# Patient Record
Sex: Female | Born: 1960 | Race: White | Hispanic: No | Marital: Married | Smoking: Current every day smoker
Health system: Southern US, Community
[De-identification: ages and names within clinical notes are randomized; demographics above are authoritative.]

## PROBLEM LIST (undated history)

## (undated) DIAGNOSIS — I10 Essential (primary) hypertension: Secondary | ICD-10-CM

## (undated) DIAGNOSIS — E7800 Pure hypercholesterolemia, unspecified: Secondary | ICD-10-CM

## (undated) DIAGNOSIS — E079 Disorder of thyroid, unspecified: Secondary | ICD-10-CM

## (undated) HISTORY — PX: BUNIONECTOMY: SHX129

## (undated) HISTORY — PX: COLONOSCOPY: SHX174

---

## 2000-02-19 ENCOUNTER — Encounter (INDEPENDENT_AMBULATORY_CARE_PROVIDER_SITE_OTHER): Payer: Self-pay | Admitting: Specialist

## 2000-02-19 ENCOUNTER — Other Ambulatory Visit: Admission: RE | Admit: 2000-02-19 | Discharge: 2000-02-19 | Payer: Self-pay | Admitting: *Deleted

## 2004-01-02 ENCOUNTER — Other Ambulatory Visit: Admission: RE | Admit: 2004-01-02 | Discharge: 2004-01-02 | Payer: Self-pay | Admitting: *Deleted

## 2004-01-03 ENCOUNTER — Encounter: Admission: RE | Admit: 2004-01-03 | Discharge: 2004-01-03 | Payer: Self-pay | Admitting: *Deleted

## 2005-03-04 ENCOUNTER — Other Ambulatory Visit: Admission: RE | Admit: 2005-03-04 | Discharge: 2005-03-04 | Payer: Self-pay | Admitting: *Deleted

## 2006-04-19 ENCOUNTER — Other Ambulatory Visit: Admission: RE | Admit: 2006-04-19 | Discharge: 2006-04-19 | Payer: Self-pay | Admitting: *Deleted

## 2007-05-01 ENCOUNTER — Other Ambulatory Visit: Admission: RE | Admit: 2007-05-01 | Discharge: 2007-05-01 | Payer: Self-pay | Admitting: *Deleted

## 2007-06-09 ENCOUNTER — Encounter: Admission: RE | Admit: 2007-06-09 | Discharge: 2007-06-09 | Payer: Self-pay | Admitting: *Deleted

## 2008-06-10 ENCOUNTER — Emergency Department (HOSPITAL_COMMUNITY): Admission: EM | Admit: 2008-06-10 | Discharge: 2008-06-11 | Payer: Self-pay | Admitting: Emergency Medicine

## 2008-11-08 ENCOUNTER — Encounter: Admission: RE | Admit: 2008-11-08 | Discharge: 2008-11-08 | Payer: Self-pay | Admitting: Obstetrics and Gynecology

## 2009-03-14 ENCOUNTER — Encounter: Admission: RE | Admit: 2009-03-14 | Discharge: 2009-03-14 | Payer: Self-pay | Admitting: Gastroenterology

## 2009-06-13 ENCOUNTER — Encounter: Admission: RE | Admit: 2009-06-13 | Discharge: 2009-06-13 | Payer: Self-pay | Admitting: Gastroenterology

## 2009-10-21 ENCOUNTER — Emergency Department (HOSPITAL_BASED_OUTPATIENT_CLINIC_OR_DEPARTMENT_OTHER): Admission: EM | Admit: 2009-10-21 | Discharge: 2009-10-21 | Payer: Self-pay | Admitting: Emergency Medicine

## 2009-10-21 ENCOUNTER — Ambulatory Visit: Payer: Self-pay | Admitting: Diagnostic Radiology

## 2010-05-15 ENCOUNTER — Encounter: Admission: RE | Admit: 2010-05-15 | Discharge: 2010-05-15 | Payer: Self-pay | Admitting: Obstetrics and Gynecology

## 2011-02-12 ENCOUNTER — Inpatient Hospital Stay (HOSPITAL_COMMUNITY)
Admission: EM | Admit: 2011-02-12 | Discharge: 2011-02-15 | DRG: 392 | Disposition: A | Discharge status: Home / Self Care | Payer: 59 | Attending: Internal Medicine | Admitting: Internal Medicine

## 2011-02-12 ENCOUNTER — Emergency Department (HOSPITAL_COMMUNITY): Payer: 59

## 2011-02-12 DIAGNOSIS — R197 Diarrhea, unspecified: Secondary | ICD-10-CM | POA: Diagnosis present

## 2011-02-12 DIAGNOSIS — K5732 Diverticulitis of large intestine without perforation or abscess without bleeding: Principal | ICD-10-CM | POA: Diagnosis present

## 2011-02-12 DIAGNOSIS — I10 Essential (primary) hypertension: Secondary | ICD-10-CM | POA: Diagnosis present

## 2011-02-12 DIAGNOSIS — F17200 Nicotine dependence, unspecified, uncomplicated: Secondary | ICD-10-CM | POA: Diagnosis present

## 2011-02-12 LAB — DIFFERENTIAL
Basophils Absolute: 0 10*3/uL (ref 0.0–0.1)
Basophils Relative: 0 % (ref 0–1)
Eosinophils Relative: 1 % (ref 0–5)
Lymphocytes Relative: 11 % — ABNORMAL LOW (ref 12–46)
Lymphs Abs: 1.1 10*3/uL (ref 0.7–4.0)
Monocytes Relative: 10 % (ref 3–12)
Neutro Abs: 7.3 10*3/uL (ref 1.7–7.7)
Neutrophils Relative %: 78 % — ABNORMAL HIGH (ref 43–77)

## 2011-02-12 LAB — URINE MICROSCOPIC-ADD ON

## 2011-02-12 LAB — URINALYSIS, ROUTINE W REFLEX MICROSCOPIC
Bilirubin Urine: NEGATIVE
Ketones, ur: NEGATIVE mg/dL
Leukocytes, UA: NEGATIVE
Nitrite: NEGATIVE
Protein, ur: NEGATIVE mg/dL
Specific Gravity, Urine: 1.025 (ref 1.005–1.030)
Urine Glucose, Fasting: NEGATIVE mg/dL
Urobilinogen, UA: 0.2 mg/dL (ref 0.0–1.0)
pH: 5.5 (ref 5.0–8.0)

## 2011-02-12 LAB — POCT I-STAT, CHEM 8
BUN: 9 mg/dL (ref 6–23)
Calcium, Ion: 1.18 mmol/L (ref 1.12–1.32)
Chloride: 106 mEq/L (ref 96–112)
Creatinine, Ser: 1.1 mg/dL (ref 0.4–1.2)
Glucose, Bld: 100 mg/dL — ABNORMAL HIGH (ref 70–99)
HCT: 41 % (ref 36.0–46.0)
Hemoglobin: 13.9 g/dL (ref 12.0–15.0)
Potassium: 3.7 mEq/L (ref 3.5–5.1)
Sodium: 139 mEq/L (ref 135–145)
TCO2: 23 mmol/L (ref 0–100)

## 2011-02-12 LAB — CBC
HCT: 39.3 % (ref 36.0–46.0)
Hemoglobin: 13.6 g/dL (ref 12.0–15.0)
MCH: 34.6 pg — ABNORMAL HIGH (ref 26.0–34.0)
MCHC: 34.6 g/dL (ref 30.0–36.0)
MCV: 100 fL (ref 78.0–100.0)
Platelets: 210 10*3/uL (ref 150–400)
RBC: 3.93 MIL/uL (ref 3.87–5.11)
RDW: 11.8 % (ref 11.5–15.5)
WBC: 9.4 10*3/uL (ref 4.0–10.5)

## 2011-02-12 LAB — TSH: TSH: 2.344 u[IU]/mL (ref 0.350–4.500)

## 2011-02-12 MED ORDER — IOHEXOL 300 MG/ML  SOLN
100.0000 mL | Freq: Once | INTRAMUSCULAR | Status: AC | PRN
  Administered 2011-02-12: 100 mL via INTRAVENOUS

## 2011-02-13 LAB — CBC
HCT: 36.3 % (ref 36.0–46.0)
Hemoglobin: 12.2 g/dL (ref 12.0–15.0)
MCH: 33.7 pg (ref 26.0–34.0)
MCHC: 33.6 g/dL (ref 30.0–36.0)
MCV: 100.3 fL — ABNORMAL HIGH (ref 78.0–100.0)
Platelets: 177 10*3/uL (ref 150–400)
RBC: 3.62 MIL/uL — ABNORMAL LOW (ref 3.87–5.11)
RDW: 11.7 % (ref 11.5–15.5)

## 2011-02-13 LAB — COMPREHENSIVE METABOLIC PANEL
ALT: 10 U/L (ref 0–35)
Albumin: 2.9 g/dL — ABNORMAL LOW (ref 3.5–5.2)
Alkaline Phosphatase: 48 U/L (ref 39–117)
BUN: 4 mg/dL — ABNORMAL LOW (ref 6–23)
CO2: 25 mEq/L (ref 19–32)
Calcium: 8.7 mg/dL (ref 8.4–10.5)
Chloride: 111 mEq/L (ref 96–112)
Creatinine, Ser: 0.95 mg/dL (ref 0.4–1.2)
GFR calc Af Amer: >60 mL/min (ref >60)
GFR calc non Af Amer: >60 mL/min (ref >60)
Glucose, Bld: 102 mg/dL — ABNORMAL HIGH (ref 70–99)
Potassium: 3.6 mEq/L (ref 3.5–5.1)
Sodium: 140 mEq/L (ref 135–145)
Total Protein: 5.6 g/dL — ABNORMAL LOW (ref 6.0–8.3)

## 2011-02-13 LAB — DIFFERENTIAL
Basophils Absolute: 0 10*3/uL (ref 0.0–0.1)
Basophils Relative: 0 % (ref 0–1)
Eosinophils Relative: 3 % (ref 0–5)
Lymphs Abs: 1 10*3/uL (ref 0.7–4.0)
Monocytes Absolute: 0.4 10*3/uL (ref 0.1–1.0)
Monocytes Relative: 8 % (ref 3–12)
Neutro Abs: 2.9 10*3/uL (ref 1.7–7.7)

## 2011-02-13 LAB — MAGNESIUM: Magnesium: 2.1 mg/dL (ref 1.5–2.5)

## 2011-02-13 LAB — PHOSPHORUS: Phosphorus: 3.1 mg/dL (ref 2.3–4.6)

## 2011-02-13 LAB — LIPID PANEL: VLDL: 20 mg/dL (ref 0–40)

## 2011-02-14 LAB — CBC
HCT: 34.5 % — ABNORMAL LOW (ref 36.0–46.0)
Hemoglobin: 11.5 g/dL — ABNORMAL LOW (ref 12.0–15.0)
MCV: 101.5 fL — ABNORMAL HIGH (ref 78.0–100.0)
RBC: 3.4 MIL/uL — ABNORMAL LOW (ref 3.87–5.11)
WBC: 4.3 10*3/uL (ref 4.0–10.5)

## 2011-02-14 LAB — DIFFERENTIAL
Lymphocytes Relative: 23 % (ref 12–46)
Lymphs Abs: 1 10*3/uL (ref 0.7–4.0)
Monocytes Relative: 9 % (ref 3–12)
Neutrophils Relative %: 64 % (ref 43–77)

## 2011-02-14 LAB — BASIC METABOLIC PANEL
BUN: 2 mg/dL — ABNORMAL LOW (ref 6–23)
CO2: 24 mEq/L (ref 19–32)
Chloride: 112 mEq/L (ref 96–112)
Glucose, Bld: 99 mg/dL (ref 70–99)
Potassium: 4 mEq/L (ref 3.5–5.1)

## 2011-02-14 LAB — URINE CULTURE
Colony Count: NO GROWTH
Culture  Setup Time: 201202250141
Culture: NO GROWTH
Special Requests: NEGATIVE

## 2011-02-14 LAB — MAGNESIUM: Magnesium: 2 mg/dL (ref 1.5–2.5)

## 2011-02-15 LAB — CBC
MCV: 99.2 fL (ref 78.0–100.0)
Platelets: 195 10*3/uL (ref 150–400)
RDW: 11.4 % — ABNORMAL LOW (ref 11.5–15.5)
WBC: 4.6 10*3/uL (ref 4.0–10.5)

## 2011-02-15 LAB — BASIC METABOLIC PANEL
BUN: 1 mg/dL — ABNORMAL LOW (ref 6–23)
Creatinine, Ser: 0.85 mg/dL (ref 0.4–1.2)
GFR calc non Af Amer: >60 mL/min (ref >60)
Potassium: 4.1 mEq/L (ref 3.5–5.1)

## 2011-02-15 LAB — DIFFERENTIAL
Basophils Relative: 0 % (ref 0–1)
Eosinophils Absolute: 0.2 10*3/uL (ref 0.0–0.7)
Eosinophils Relative: 4 % (ref 0–5)
Lymphs Abs: 1.1 10*3/uL (ref 0.7–4.0)

## 2011-02-15 LAB — MAGNESIUM: Magnesium: 2 mg/dL (ref 1.5–2.5)

## 2011-02-25 IMAGING — CT CT ABD-PELV W/ CM
2 of 5 series · 17 of 46 positions shown, 19 images · IV contrast (agent unspecified)
Comparison: 10/21/2009.  06/13/2009.

CLINICAL DATA: Left lower quadrant pain.  History of
diverticulitis.

CT ABDOMEN AND PELVIS WITH CONTRAST
TECHNIQUE: Multidetector CT imaging of the abdomen and pelvis was
performed following the standard protocol during bolus
administration of intravenous contrast.
Contrast: 100 ml Fmnipaque-YCC.

[Series 2: rtn a/p with · axial · 0.63mm/px · z∈[-574,-198]mm · 14 of 85 slices shown, 16 images]
[im 5/85  soft-tissue]
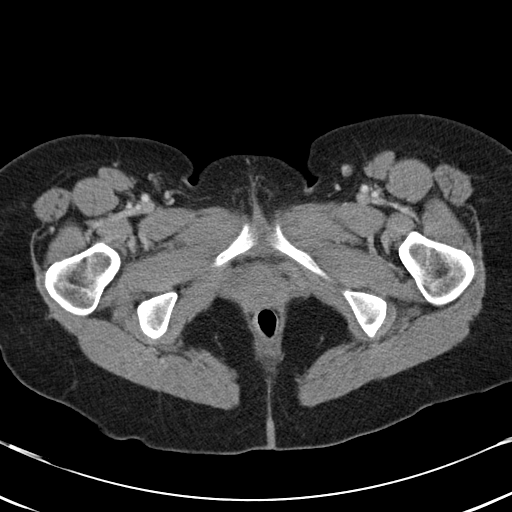
[im 5/85  bone]
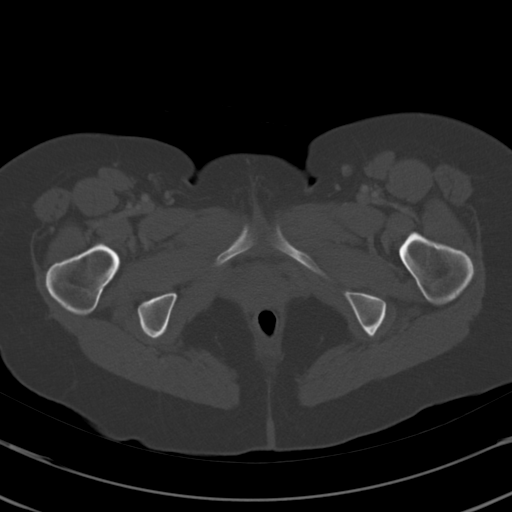
[im 9/85  soft-tissue]
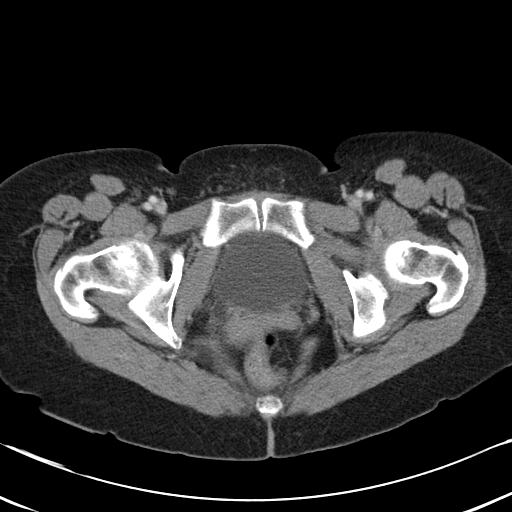
[im 18/85  soft-tissue]
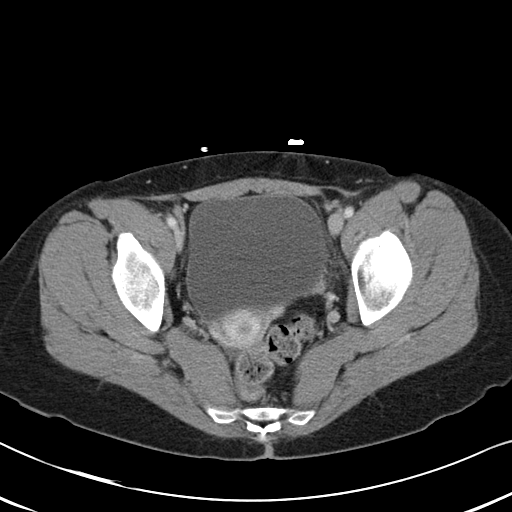
[im 23/85  soft-tissue]
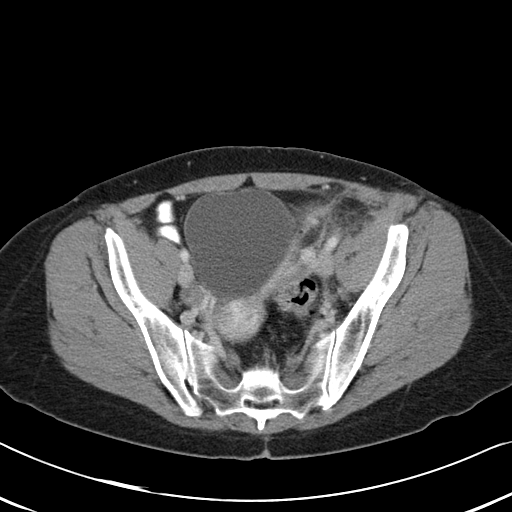
[im 27/85  soft-tissue]
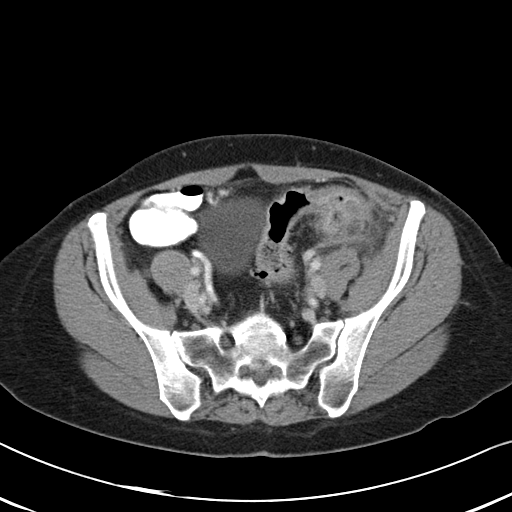
[im 36/85  soft-tissue]
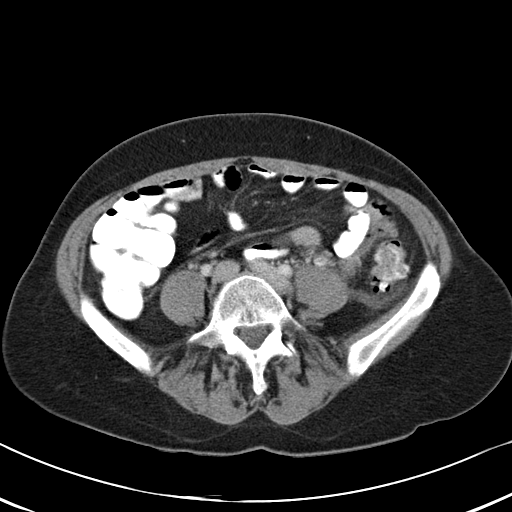
[im 40/85  soft-tissue]
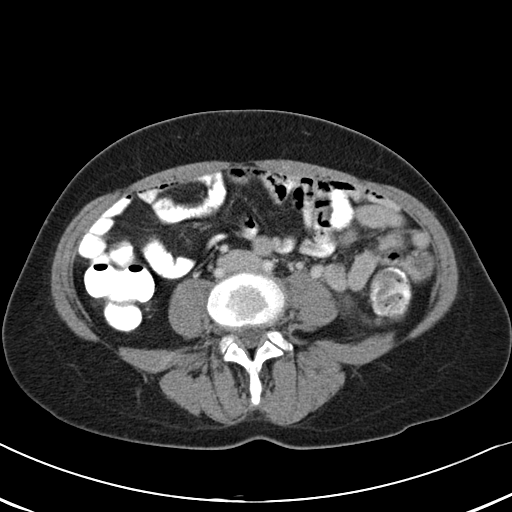
[im 45/85  soft-tissue]
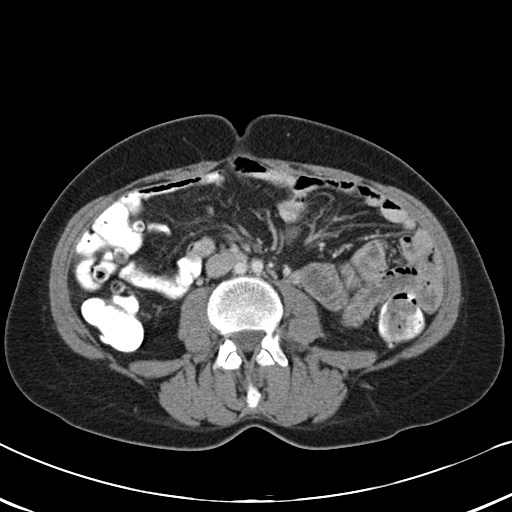
[im 49/85  soft-tissue]
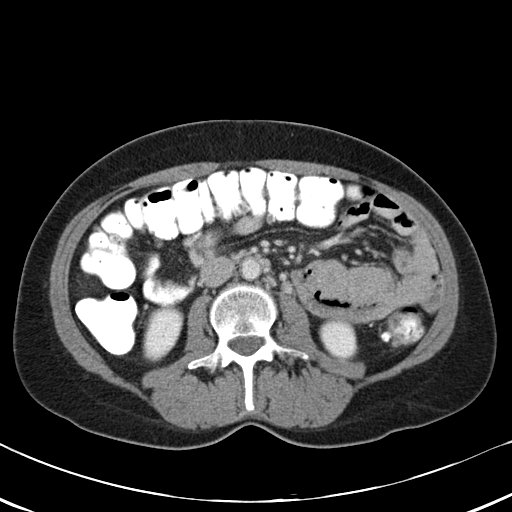
[im 49/85  bone]
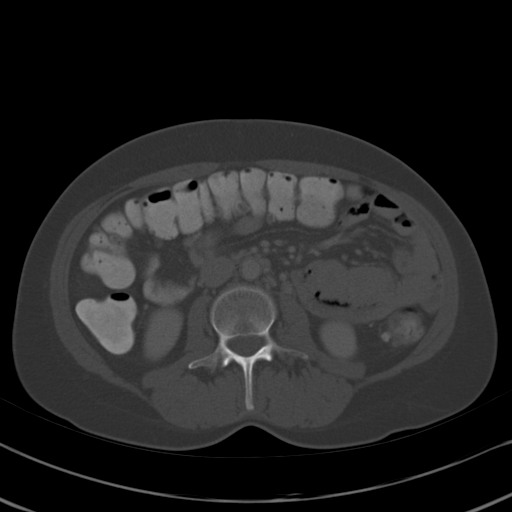
[im 58/85  soft-tissue]
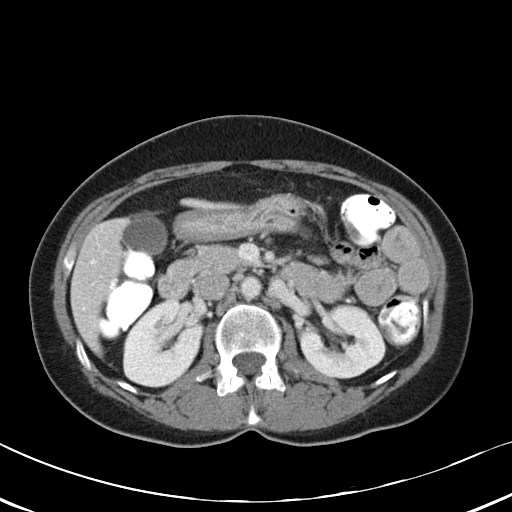
[im 62/85  soft-tissue]
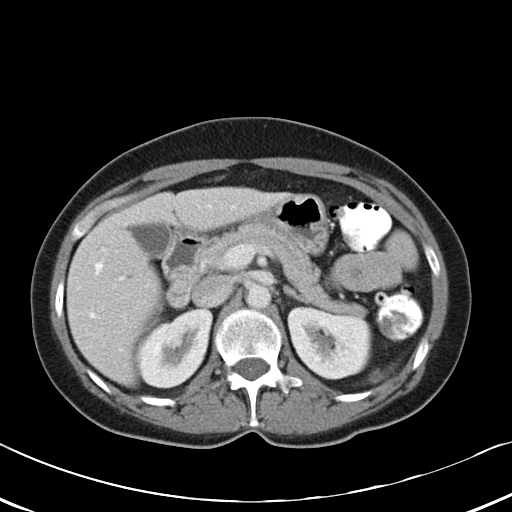
[im 67/85  soft-tissue]
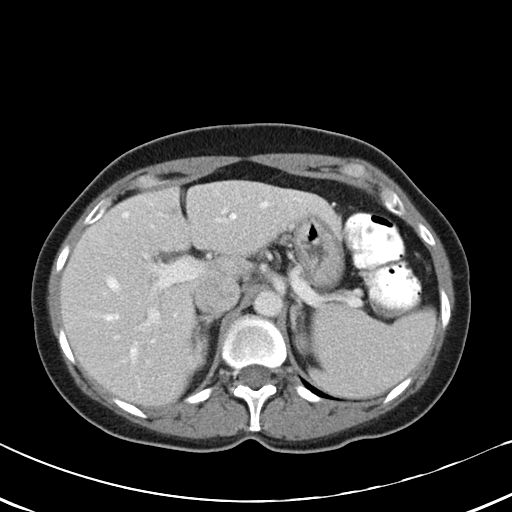
[im 76/85  soft-tissue]
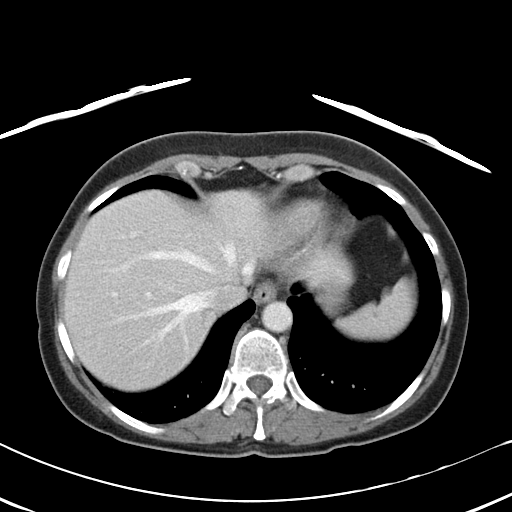
[im 80/85  soft-tissue]
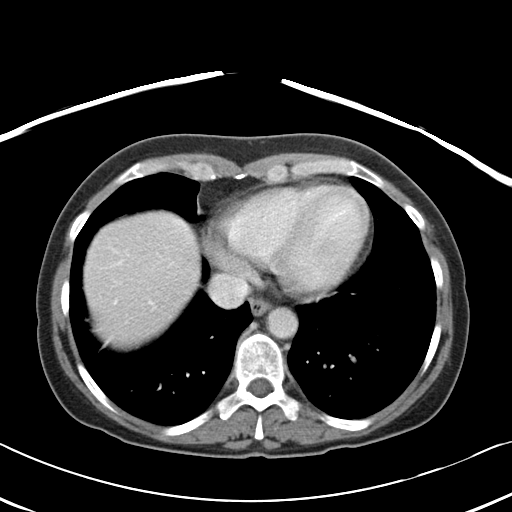

[Series 602: <mpr thick range> · coronal · 0.83mm/px · 3 of 72 slices shown]
[im 24/72  soft-tissue]
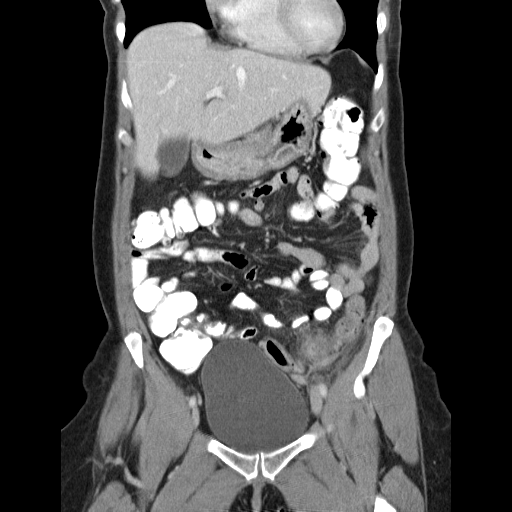
[im 32/72  soft-tissue]
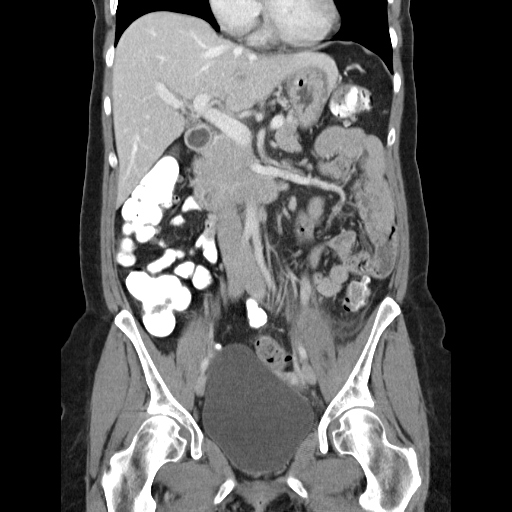
[im 40/72  soft-tissue]
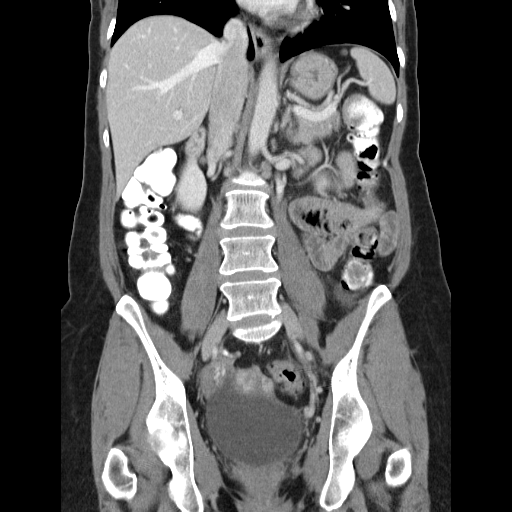

[17 of 46 positions shown; findings below may reference images not displayed]

FINDINGS: Diverticulitis at the junction of the descending colon
and sigmoid colon in a similar location to the previously noted
diverticulitis (06/13/2009).  Inflammation extends along the
superficial aspect of the left psoas muscles and left pericolic
gutter region without well-defined drainable abscess.  After acute
episode has cleared, evaluation of this segment of bowel is
recommend to exclude underlying lesion.

Lung bases clear.  Chronic infarct in the inferior aspect of the
spleen without change otherwise no focal splenic lesion noted.
Small focal fatty infiltration and left lobe liver otherwise no
focal hepatic lesion.  No focal pancreatic, adrenal or renal
lesion.

No calcified gallstones.

Under distended stomach without gross abnormality.

No abdominal aortic aneurysm.  No bony destructive lesion.

Small amount of free fluid within the pelvis may be related to
ovulation or possibly the diverticulitis.  No pelvic or abdominal
adenopathy.  Urinary bladder grossly within normal limits.
IMPRESSION: Diverticulitis at the junction of the descending colon and sigmoid
colon in a similar location to the previously noted diverticulitis
(06/13/2009).  Inflammation extends along the superficial aspect of
the left psoas muscles and left pericolic gutter region without
well-defined drainable abscess.

Critical test results telephoned to Dr. Oul at the time of
interpretation on 02/12/2009 at [DATE] a.m.

## 2011-03-24 LAB — URINALYSIS, ROUTINE W REFLEX MICROSCOPIC
Bilirubin Urine: NEGATIVE
Protein, ur: NEGATIVE mg/dL
Urobilinogen, UA: 0.2 mg/dL (ref 0.0–1.0)

## 2011-09-16 LAB — CBC
HCT: 38.8
Hemoglobin: 13.2
MCHC: 34.1
MCV: 100.8 — ABNORMAL HIGH
Platelets: 180
RBC: 3.85 — ABNORMAL LOW
RDW: 12.7
WBC: 8.4

## 2011-09-16 LAB — URINE MICROSCOPIC-ADD ON

## 2011-09-16 LAB — COMPREHENSIVE METABOLIC PANEL WITH GFR
AST: 13
Albumin: 3.3 — ABNORMAL LOW
Alkaline Phosphatase: 50
BUN: 5 — ABNORMAL LOW
Chloride: 104
GFR calc Af Amer: >60
Potassium: 3.6
Sodium: 140
Total Bilirubin: 0.9
Total Protein: 6.6

## 2011-09-16 LAB — URINALYSIS, ROUTINE W REFLEX MICROSCOPIC
Bilirubin Urine: NEGATIVE
Glucose, UA: NEGATIVE
Ketones, ur: NEGATIVE
Leukocytes, UA: NEGATIVE
Nitrite: NEGATIVE
Protein, ur: NEGATIVE
Specific Gravity, Urine: 1.008
Urobilinogen, UA: 0.2
pH: 7.5

## 2011-09-16 LAB — DIFFERENTIAL
Basophils Absolute: 0
Basophils Relative: 0
Eosinophils Absolute: 0.1
Eosinophils Relative: 1
Lymphocytes Relative: 13
Lymphs Abs: 1.1
Monocytes Absolute: 0.8
Monocytes Relative: 9
Neutro Abs: 6.4
Neutrophils Relative %: 77

## 2011-09-16 LAB — LIPASE, BLOOD: Lipase: 17

## 2011-09-16 LAB — SAMPLE TO BLOOD BANK

## 2011-09-16 LAB — COMPREHENSIVE METABOLIC PANEL
ALT: 9
CO2: 29
Calcium: 9
Creatinine, Ser: 0.74
GFR calc non Af Amer: >60
Glucose, Bld: 70

## 2011-09-16 LAB — WET PREP, GENITAL
Trich, Wet Prep: NONE SEEN
Yeast Wet Prep HPF POC: NONE SEEN

## 2011-09-16 LAB — GC/CHLAMYDIA PROBE AMP, GENITAL
Chlamydia, DNA Probe: NEGATIVE
GC Probe Amp, Genital: NEGATIVE

## 2011-09-16 LAB — PREGNANCY, URINE: Preg Test, Ur: NEGATIVE

## 2011-10-20 ENCOUNTER — Other Ambulatory Visit: Payer: Self-pay | Admitting: Obstetrics and Gynecology

## 2011-10-20 DIAGNOSIS — Z1231 Encounter for screening mammogram for malignant neoplasm of breast: Secondary | ICD-10-CM

## 2011-11-12 ENCOUNTER — Ambulatory Visit: Payer: 59

## 2012-06-09 ENCOUNTER — Ambulatory Visit
Admission: RE | Admit: 2012-06-09 | Discharge: 2012-06-09 | Disposition: A | Discharge status: Home / Self Care | Payer: Self-pay | Source: Ambulatory Visit | Attending: Obstetrics and Gynecology | Admitting: Obstetrics and Gynecology

## 2012-06-09 DIAGNOSIS — Z1231 Encounter for screening mammogram for malignant neoplasm of breast: Secondary | ICD-10-CM

## 2012-10-04 ENCOUNTER — Other Ambulatory Visit (HOSPITAL_COMMUNITY): Payer: Self-pay | Admitting: Gastroenterology

## 2012-10-04 DIAGNOSIS — Z8719 Personal history of other diseases of the digestive system: Secondary | ICD-10-CM

## 2012-10-04 DIAGNOSIS — R109 Unspecified abdominal pain: Secondary | ICD-10-CM

## 2012-10-05 ENCOUNTER — Encounter (HOSPITAL_COMMUNITY): Payer: Self-pay

## 2012-10-05 ENCOUNTER — Ambulatory Visit (HOSPITAL_COMMUNITY)
Admission: RE | Admit: 2012-10-05 | Discharge: 2012-10-05 | Disposition: A | Discharge status: Home / Self Care | Payer: BC Managed Care – PPO | Source: Ambulatory Visit | Attending: Gastroenterology | Admitting: Gastroenterology

## 2012-10-05 DIAGNOSIS — R109 Unspecified abdominal pain: Secondary | ICD-10-CM

## 2012-10-05 DIAGNOSIS — Z8719 Personal history of other diseases of the digestive system: Secondary | ICD-10-CM

## 2012-10-05 DIAGNOSIS — R197 Diarrhea, unspecified: Secondary | ICD-10-CM | POA: Insufficient documentation

## 2012-10-05 DIAGNOSIS — K5730 Diverticulosis of large intestine without perforation or abscess without bleeding: Secondary | ICD-10-CM | POA: Insufficient documentation

## 2012-10-05 MED ORDER — IOHEXOL 300 MG/ML  SOLN
100.0000 mL | Freq: Once | INTRAMUSCULAR | Status: AC | PRN
  Administered 2012-10-05: 100 mL via INTRAVENOUS

## 2013-09-04 ENCOUNTER — Other Ambulatory Visit: Payer: Self-pay

## 2013-09-04 DIAGNOSIS — Z1231 Encounter for screening mammogram for malignant neoplasm of breast: Secondary | ICD-10-CM

## 2013-09-21 ENCOUNTER — Ambulatory Visit
Admission: RE | Admit: 2013-09-21 | Discharge: 2013-09-21 | Disposition: A | Discharge status: Home / Self Care | Payer: BC Managed Care – PPO | Source: Ambulatory Visit

## 2013-09-21 DIAGNOSIS — Z1231 Encounter for screening mammogram for malignant neoplasm of breast: Secondary | ICD-10-CM

## 2014-11-27 ENCOUNTER — Other Ambulatory Visit: Payer: Self-pay

## 2014-11-27 DIAGNOSIS — Z1231 Encounter for screening mammogram for malignant neoplasm of breast: Secondary | ICD-10-CM

## 2014-12-27 ENCOUNTER — Ambulatory Visit: Payer: BC Managed Care – PPO

## 2015-03-14 ENCOUNTER — Ambulatory Visit
Admission: RE | Admit: 2015-03-14 | Discharge: 2015-03-14 | Disposition: A | Discharge status: Home / Self Care | Payer: 59 | Source: Ambulatory Visit

## 2015-03-14 DIAGNOSIS — Z1231 Encounter for screening mammogram for malignant neoplasm of breast: Secondary | ICD-10-CM

## 2015-09-17 ENCOUNTER — Ambulatory Visit: Payer: 59 | Admitting: *Deleted

## 2015-10-22 ENCOUNTER — Ambulatory Visit: Payer: 59 | Admitting: *Deleted

## 2016-03-29 ENCOUNTER — Other Ambulatory Visit: Payer: Self-pay

## 2016-03-29 DIAGNOSIS — Z1231 Encounter for screening mammogram for malignant neoplasm of breast: Secondary | ICD-10-CM

## 2016-04-16 ENCOUNTER — Ambulatory Visit
Admission: RE | Admit: 2016-04-16 | Discharge: 2016-04-16 | Disposition: A | Discharge status: Home / Self Care | Payer: 59 | Source: Ambulatory Visit

## 2016-04-16 DIAGNOSIS — Z1231 Encounter for screening mammogram for malignant neoplasm of breast: Secondary | ICD-10-CM

## 2016-09-10 ENCOUNTER — Encounter: Payer: Self-pay | Admitting: *Deleted

## 2016-09-15 ENCOUNTER — Ambulatory Visit (INDEPENDENT_AMBULATORY_CARE_PROVIDER_SITE_OTHER): Payer: Self-pay | Admitting: *Deleted

## 2016-09-15 ENCOUNTER — Encounter: Payer: Self-pay | Admitting: *Deleted

## 2016-09-15 DIAGNOSIS — I8393 Asymptomatic varicose veins of bilateral lower extremities: Secondary | ICD-10-CM

## 2016-09-15 NOTE — Progress Notes (Signed)
   Cutaneous Laser:pulsed mode  810j/cm2 400 ms delay  13 ms Duration 0.5 spot  Total pulses: 895 Total energy 1.420  Total time::10  Pt had small red vessels that were more a candidate for cutaneous laser than sclerotherapy. Tol well with good reaction from the laser. Anticipate good results. Follow prn.

## 2016-11-23 ENCOUNTER — Encounter: Payer: Self-pay | Admitting: *Deleted

## 2016-11-24 ENCOUNTER — Ambulatory Visit: Payer: 59 | Admitting: *Deleted

## 2016-11-24 DIAGNOSIS — I8393 Asymptomatic varicose veins of bilateral lower extremities: Secondary | ICD-10-CM

## 2016-11-24 NOTE — Progress Notes (Signed)
Looked at the areas that were previously treated. Retreated just a few vessels that appeared to still be opened. No charge for this visit since I treated her for less than a minute. Follow prn.

## 2018-06-23 ENCOUNTER — Encounter (HOSPITAL_BASED_OUTPATIENT_CLINIC_OR_DEPARTMENT_OTHER): Payer: Self-pay | Admitting: Emergency Medicine

## 2018-06-23 ENCOUNTER — Other Ambulatory Visit: Payer: Self-pay

## 2018-06-23 ENCOUNTER — Emergency Department (HOSPITAL_COMMUNITY)
Admission: EM | Admit: 2018-06-23 | Discharge: 2018-06-23 | Disposition: A | Discharge status: Home / Self Care | Payer: 59 | Attending: Emergency Medicine | Admitting: Emergency Medicine

## 2018-06-23 ENCOUNTER — Encounter (HOSPITAL_COMMUNITY): Payer: Self-pay | Admitting: Emergency Medicine

## 2018-06-23 ENCOUNTER — Emergency Department (HOSPITAL_BASED_OUTPATIENT_CLINIC_OR_DEPARTMENT_OTHER)
Admission: EM | Admit: 2018-06-23 | Discharge: 2018-06-23 | Disposition: A | Discharge status: Home / Self Care | Payer: 59 | Source: Home / Self Care | Attending: Emergency Medicine | Admitting: Emergency Medicine

## 2018-06-23 ENCOUNTER — Emergency Department (HOSPITAL_BASED_OUTPATIENT_CLINIC_OR_DEPARTMENT_OTHER): Payer: 59

## 2018-06-23 DIAGNOSIS — Z5321 Procedure and treatment not carried out due to patient leaving prior to being seen by health care provider: Secondary | ICD-10-CM | POA: Diagnosis not present

## 2018-06-23 DIAGNOSIS — R109 Unspecified abdominal pain: Secondary | ICD-10-CM | POA: Insufficient documentation

## 2018-06-23 DIAGNOSIS — K5732 Diverticulitis of large intestine without perforation or abscess without bleeding: Secondary | ICD-10-CM

## 2018-06-23 DIAGNOSIS — Z79899 Other long term (current) drug therapy: Secondary | ICD-10-CM

## 2018-06-23 DIAGNOSIS — I10 Essential (primary) hypertension: Secondary | ICD-10-CM | POA: Insufficient documentation

## 2018-06-23 HISTORY — DX: Essential (primary) hypertension: I10

## 2018-06-23 LAB — COMPREHENSIVE METABOLIC PANEL
ALBUMIN: 3.6 g/dL (ref 3.5–5.0)
ALT: 13 U/L (ref 0–44)
AST: 15 U/L (ref 15–41)
Alkaline Phosphatase: 73 U/L (ref 38–126)
Anion gap: 8 (ref 5–15)
BUN: 14 mg/dL (ref 6–20)
CHLORIDE: 103 mmol/L (ref 98–111)
CO2: 28 mmol/L (ref 22–32)
Calcium: 9 mg/dL (ref 8.9–10.3)
Creatinine, Ser: 0.98 mg/dL (ref 0.44–1.00)
GFR calc Af Amer: >60 mL/min (ref >60)
GFR calc non Af Amer: >60 mL/min (ref >60)
GLUCOSE: 116 mg/dL — AB (ref 70–99)
POTASSIUM: 3.5 mmol/L (ref 3.5–5.1)
Sodium: 139 mmol/L (ref 135–145)
Total Bilirubin: 0.5 mg/dL (ref 0.3–1.2)
Total Protein: 7.1 g/dL (ref 6.5–8.1)

## 2018-06-23 LAB — CBC
HEMATOCRIT: 38.6 % (ref 36.0–46.0)
Hemoglobin: 12.8 g/dL (ref 12.0–15.0)
MCH: 33.6 pg (ref 26.0–34.0)
MCHC: 33.2 g/dL (ref 30.0–36.0)
MCV: 101.3 fL — ABNORMAL HIGH (ref 78.0–100.0)
Platelets: 301 10*3/uL (ref 150–400)
RBC: 3.81 MIL/uL — ABNORMAL LOW (ref 3.87–5.11)
RDW: 13.3 % (ref 11.5–15.5)
WBC: 11.5 10*3/uL — ABNORMAL HIGH (ref 4.0–10.5)

## 2018-06-23 LAB — URINALYSIS, MICROSCOPIC (REFLEX): WBC, UA: NONE SEEN WBC/hpf (ref 0–5)

## 2018-06-23 LAB — URINALYSIS, ROUTINE W REFLEX MICROSCOPIC
Bilirubin Urine: NEGATIVE
Glucose, UA: NEGATIVE mg/dL
Ketones, ur: NEGATIVE mg/dL
Leukocytes, UA: NEGATIVE
Nitrite: NEGATIVE
Protein, ur: NEGATIVE mg/dL
Specific Gravity, Urine: >1.03 — ABNORMAL HIGH (ref 1.005–1.030)
pH: 5.5 (ref 5.0–8.0)

## 2018-06-23 LAB — LIPASE, BLOOD: LIPASE: 26 U/L (ref 11–51)

## 2018-06-23 LAB — PREGNANCY, URINE: Preg Test, Ur: NEGATIVE

## 2018-06-23 MED ORDER — AMOXICILLIN-POT CLAVULANATE 875-125 MG PO TABS: 1.0000 | ORAL_TABLET | Freq: Two times a day (BID) | ORAL | 0 refills | Status: DC

## 2018-06-23 MED ORDER — IOPAMIDOL (ISOVUE-300) INJECTION 61%
100.0000 mL | Freq: Once | INTRAVENOUS | Status: AC | PRN
  Administered 2018-06-23: 100 mL via INTRAVENOUS

## 2018-06-23 MED ORDER — HYDROCODONE-ACETAMINOPHEN 5-325 MG PO TABS: 1.0000 | ORAL_TABLET | Freq: Four times a day (QID) | ORAL | 0 refills | Status: DC | PRN

## 2018-06-23 MED ORDER — CIPROFLOXACIN HCL 500 MG PO TABS: 500.0000 mg | ORAL_TABLET | Freq: Two times a day (BID) | ORAL | 0 refills | Status: DC

## 2018-06-23 MED ORDER — ONDANSETRON HCL 4 MG PO TABS: 4.0000 mg | ORAL_TABLET | Freq: Four times a day (QID) | ORAL | 0 refills | Status: AC

## 2018-06-23 MED FILL — ONDANSETRON HCL 4 MG TABLET: 4 | 3 days supply | Qty: 12 | Fill #0

## 2018-06-23 MED FILL — HYDROCODON-APAP 5-325: 5-325 | 2 days supply | Qty: 12 | Fill #0

## 2018-06-23 MED FILL — CIPROFLOXACIN HCL 500 MG TA: 500 | 7 days supply | Qty: 14 | Fill #0

## 2018-06-23 NOTE — Discharge Instructions (Signed)
Medications: Cipro, Augmentin, Zofran, Norco  Treatment: Take Cipro and Augmentin until completed.  Take Zofran every 6 hours as needed for nausea or vomiting.  You can take ibuprofen as prescribed over-the-counter, as needed for your pain.  Take 1-2 Norco as needed for breakthrough pain.  Do not drink alcohol, drive, operate machinery or participate in any other potentially dangerous activities while taking opiate pain medication as it may make you sleepy. Do not take this medication with any other sedating medications, either prescription or over-the-counter. If you were prescribed Percocet or Vicodin, do not take these with acetaminophen (Tylenol) as it is already contained within these medications and overdose of Tylenol is dangerous.   This medication is an opiate (or narcotic) pain medication and can be habit forming.  Use it as little as possible to achieve adequate pain control.  Do not use or use it with extreme caution if you have a history of opiate abuse or dependence. This medication is intended for your use only - do not give any to anyone else and keep it in a secure place where nobody else, especially children, have access to it. It will also cause or worsen constipation, so you may want to consider taking an over-the-counter stool softener while you are taking this medication.  Follow-up: Please follow-up with your doctor early next week for recheck.  Please return the emergency department if you develop any new or worsening symptoms including worsening pain, intractable vomiting, or any other new or concerning symptom.

## 2018-06-23 NOTE — ED Triage Notes (Addendum)
Reports lower abdominal pain.  Denies N/V/D, dysuria.  [facility]  Had blood drawn.

## 2018-06-23 NOTE — ED Notes (Signed)
Patient transported to CT 

## 2018-06-23 NOTE — ED Triage Notes (Signed)
Pt complaint of lower abd pain that went away last week and returned; denies n/v/d.

## 2018-06-23 NOTE — ED Notes (Signed)
Pt handed in labels to registration clerk and sts they are leaving

## 2018-06-23 NOTE — ED Provider Notes (Signed)
[facility] EMERGENCY DEPARTMENT Provider Note   CSN: [id number] Arrival date & time: 06/23/18  1541     History   Chief Complaint Chief Complaint  Patient presents with  . Abdominal Pain    HPI Dana Schmitt is a 57 y.o. female with history of hypertension, diverticulitis, rectal prolapse who presents with a 3-day history of lower abdominal pain.  She reports she had a similar pain 1 week ago which resolved on its own.  She reports taking a couple doses of her at home antibiotics for diverticulitis, however it resolved overnight.  She stopped taking.  She reports having a fever up to 100.7.  She has been taking ibuprofen at home.  She denies any chest pain, shortness of breath, nausea, vomiting, abnormal vaginal bleeding or discharge, urinary symptoms.  She reports she has been having pressure in her rectum, however she has been dealing with that for the past 6 months and is being evaluated by her doctor for rectal prolapse.  This is unchanged since her abdominal pain began.  HPI  Past Medical History:  Diagnosis Date  . Hypertension     There are no active problems to display for this patient.   History reviewed. No pertinent surgical history.   OB History   None      Home Medications    Prior to Admission medications   Medication Sig Start Date End Date Taking? Authorizing Provider  levothyroxine (SYNTHROID, LEVOTHROID) 50 MCG tablet Take 50 mcg by mouth daily before breakfast.   Yes [provider]  lisinopril-hydrochlorothiazide (PRINZIDE,ZESTORETIC) 20-12.5 MG tablet Take 1 tablet by mouth daily.   Yes [provider]  mesalamine (CANASA) 1000 MG suppository Place 1,000 mg rectally at bedtime.   Yes [provider]  amoxicillin-clavulanate (AUGMENTIN) 875-125 MG tablet Take 1 tablet by mouth every 12 (twelve) hours. 06/23/18   Bryker Fletchall, Waylan BogaAlexandra M, PA-C  ciprofloxacin (CIPRO) 500 MG tablet Take 1 tablet (500 mg total) by mouth 2 (two)  times daily. 06/23/18   Gabrial Poppell, Waylan BogaAlexandra M, PA-C  HYDROcodone-acetaminophen (NORCO/VICODIN) 5-325 MG tablet Take 1-2 tablets by mouth every 6 (six) hours as needed. 06/23/18   Everette Dimauro, Waylan BogaAlexandra M, PA-C  ondansetron (ZOFRAN) 4 MG tablet Take 1 tablet (4 mg total) by mouth every 6 (six) hours. 06/23/18   Emi HolesLaw, Lanitra Battaglini M, PA-C    Family History History reviewed. No pertinent family history.  Social History Social History   Tobacco Use  . Smoking status: Not on file  Substance Use Topics  . Alcohol use: Not on file  . Drug use: Not on file     Allergies   Patient has no known allergies.   Review of Systems Review of Systems  Constitutional: Positive for fever. Negative for chills.  HENT: Negative for facial swelling and sore throat.   Respiratory: Negative for shortness of breath.   Cardiovascular: Negative for chest pain.  Gastrointestinal: Negative for abdominal pain, blood in stool, constipation, diarrhea, nausea and vomiting.  Genitourinary: Negative for dysuria and flank pain.  Musculoskeletal: Negative for back pain.  Skin: Negative for rash and wound.  Neurological: Negative for headaches.  Psychiatric/Behavioral: The patient is not nervous/anxious.      Physical Exam Updated Vital Signs BP 111/71   Pulse 86   Temp 98.3 F (36.8 C)   Resp 16   Ht 5\' 3"  (1.6 m)   Wt 63 kg (409139 lb)   SpO2 98%   BMI 24.62 kg/m   Physical Exam  Constitutional: She appears well-developed and well-nourished. No distress.  HENT:  Head: Normocephalic and atraumatic.  Mouth/Throat: Oropharynx is clear and moist. No oropharyngeal exudate.  Eyes: Pupils are equal, round, and reactive to light. Conjunctivae are normal. Right eye exhibits no discharge. Left eye exhibits no discharge. No scleral icterus.  Neck: Normal range of motion. Neck supple. No thyromegaly present.  Cardiovascular: Normal rate, regular rhythm, normal heart sounds and intact distal pulses. Exam reveals no gallop and no  friction rub.  No murmur heard. Pulmonary/Chest: Effort normal and breath sounds normal. No stridor. No respiratory distress. She has no wheezes. She has no rales.  Abdominal: Soft. Bowel sounds are normal. She exhibits no distension. There is tenderness in the right lower quadrant and suprapubic area. There is no rebound, no guarding and no CVA tenderness.  Musculoskeletal: She exhibits no edema.  Lymphadenopathy:    She has no cervical adenopathy.  Neurological: She is alert. Coordination normal.  Skin: Skin is warm and dry. No rash noted. She is not diaphoretic. No pallor.  Psychiatric: She has a normal mood and affect.  Nursing note and vitals reviewed.    ED Treatments / Results  Labs (all labs ordered are listed, but only abnormal results are displayed) Labs Reviewed  URINALYSIS, ROUTINE W REFLEX MICROSCOPIC - Abnormal; Notable for the following components:      Result Value   Specific Gravity, Urine >1.030 (*)    Hgb urine dipstick MODERATE (*)    All other components within normal limits  URINALYSIS, MICROSCOPIC (REFLEX) - Abnormal; Notable for the following components:   Bacteria, UA RARE (*)    All other components within normal limits  PREGNANCY, URINE    EKG None  Radiology Ct Abdomen Pelvis W Contrast  Result Date: 06/23/2018 CLINICAL DATA:  Left lower quadrant pain. History of diverticulitis. EXAM: CT ABDOMEN AND PELVIS WITH CONTRAST TECHNIQUE: Multidetector CT imaging of the abdomen and pelvis was performed using the standard protocol following bolus administration of intravenous contrast. CONTRAST:  ISOVUE-300 IOPAMIDOL (ISOVUE-300) INJECTION 61% COMPARISON:  October 05, 2012 FINDINGS: Lower chest: No acute abnormality. Hepatobiliary: Low-attenuation adjacent to the falciform ligament is likely focal fatty deposition. Mild hepatic steatosis. The gallbladder is normal. The portal vein is patent. Pancreas: Unremarkable. No pancreatic ductal dilatation or  surrounding inflammatory changes. Spleen: Normal in size without focal abnormality. Adrenals/Urinary Tract: Adrenal glands are unremarkable. Kidneys are normal, without renal calculi, focal lesion, or hydronephrosis. Bladder is unremarkable. Stomach/Bowel: The stomach and small bowel are normal. There is marked wall thickening in the sigmoid colon with adjacent fat stranding. Multiple diverticula are seen in this segment of colon. No abscess or extraluminal gas/fluid. Other than colonic diverticuli, the remainder of the colon is normal. The appendix is normal. Vascular/Lymphatic: No significant vascular findings are present. No enlarged abdominal or pelvic lymph nodes. Reproductive: Uterus and bilateral adnexa are unremarkable. Other: There is a small amount of free fluid in the pelvis, likely reactive to the colonic process. Musculoskeletal: No acute or significant osseous findings. IMPRESSION: 1. The findings are most consistent with diverticulitis over several cm of sigmoid colon. Recommend follow-up to resolution. 2. A small amount of fluid in the pelvis could either be reactive to the colonic process or physiologic. Electronically Signed   By: Gerome Sam III M.D   On: 06/23/2018 17:00    Procedures Procedures (including critical care time)  Medications Ordered in ED Medications  iopamidol (ISOVUE-300) 61 % injection 100 mL (100 mLs Intravenous Contrast Given  06/23/18 1639)     Initial Impression / Assessment and Plan / ED Course  I have reviewed the triage vital signs and the nursing notes.  Pertinent labs & imaging results that were available during my care of the patient were reviewed by me and considered in my medical decision making (see chart for details).     Patient with diverticulitis seen on CT scan.  Labs conducted earlier showed mild leukocytosis, 11.5.  CMP within normal limits except glucose 116.  Lipase within normal limits.  UA shows moderate hematuria, 6-10 RBCs, rare  bacteria.  Patient reports history of hematuria, but advised to follow-up with PCP for recheck of this.  Patient is very well-appearing and declined any pain medication in the ED.  Patient has allergy to Flagyl her GI doctor treats with Cipro and Augmentin.  Will treat with same.  Will discharge home with short course of Norco for pain control as needed as well as Zofran.  I reviewed the [place] narcotic database and found no discrepancies.  Return precautions discussed.  Patient understands and agrees with plan.  Patient vitals stable throughout ED course and discharged in satisfactory condition.  Final Clinical Impressions(s) / ED Diagnoses   Final diagnoses:  Diverticulitis of sigmoid colon    ED Discharge Orders        Ordered    amoxicillin-clavulanate (AUGMENTIN) 875-125 MG tablet  Every 12 hours     06/23/18 1715    ciprofloxacin (CIPRO) 500 MG tablet  2 times daily     06/23/18 1715    HYDROcodone-acetaminophen (NORCO/VICODIN) 5-325 MG tablet  Every 6 hours PRN     06/23/18 1715    ondansetron (ZOFRAN) 4 MG tablet  Every 6 hours     06/23/18 1715       Huy Majid, [place], PA-C 06/23/18 1734    Arby Barrette, MD 06/27/18 1232

## 2018-07-05 ENCOUNTER — Ambulatory Visit: Payer: 59 | Admitting: *Deleted

## 2018-07-05 ENCOUNTER — Encounter: Payer: Self-pay | Admitting: *Deleted

## 2018-07-05 DIAGNOSIS — I8393 Asymptomatic varicose veins of bilateral lower extremities: Secondary | ICD-10-CM

## 2018-07-05 NOTE — Progress Notes (Signed)
  Cutaneous Laser:pulsed mode Treated all areas of concern. Tiny red vessels. Good local reaction. May need sclero in future. Follow prn. 812j/cm2 400 ms delay  13 ms Duration 0.5 spot  Total pulses: 1223 Total energy 1.928  Total time::17  Photos: Yes.    Compression stockings applied: No.N/A

## 2018-08-22 ENCOUNTER — Ambulatory Visit: Payer: 59

## 2018-08-22 DIAGNOSIS — I8393 Asymptomatic varicose veins of bilateral lower extremities: Secondary | ICD-10-CM

## 2018-08-22 NOTE — Progress Notes (Signed)
.    Cutaneous Laser:pulsed mode  810j/cm2 400 ms delay  14 ms Duration 0.5 spot  Total pulses: 1410 Total energy 2.231kJ  Total time:19 minutes  Photos: No.  Compression stockings applied: No. Retreated small bilateral leg capillaries with cutaneous laser. Patient tolerated well. Expect good results. Will follow PRN.

## 2020-01-15 ENCOUNTER — Ambulatory Visit: Payer: 59 | Attending: Internal Medicine

## 2022-05-27 ENCOUNTER — Emergency Department (HOSPITAL_COMMUNITY)
Admission: EM | Admit: 2022-05-27 | Discharge: 2022-05-27 | Disposition: A | Discharge status: Home / Self Care | Payer: 59 | Attending: Emergency Medicine | Admitting: Emergency Medicine

## 2022-05-27 ENCOUNTER — Other Ambulatory Visit: Payer: Self-pay

## 2022-05-27 ENCOUNTER — Encounter (HOSPITAL_COMMUNITY): Payer: Self-pay

## 2022-05-27 DIAGNOSIS — R110 Nausea: Secondary | ICD-10-CM | POA: Diagnosis not present

## 2022-05-27 DIAGNOSIS — R42 Dizziness and giddiness: Secondary | ICD-10-CM | POA: Diagnosis present

## 2022-05-27 DIAGNOSIS — Z5321 Procedure and treatment not carried out due to patient leaving prior to being seen by health care provider: Secondary | ICD-10-CM | POA: Insufficient documentation

## 2022-05-27 DIAGNOSIS — R5383 Other fatigue: Secondary | ICD-10-CM | POA: Insufficient documentation

## 2022-05-27 HISTORY — DX: Pure hypercholesterolemia, unspecified: E78.00

## 2022-05-27 HISTORY — DX: Disorder of thyroid, unspecified: E07.9

## 2022-05-27 LAB — URINALYSIS, ROUTINE W REFLEX MICROSCOPIC
Bilirubin Urine: NEGATIVE
Glucose, UA: NEGATIVE mg/dL
Ketones, ur: 20 mg/dL — AB
Nitrite: POSITIVE — AB
Protein, ur: NEGATIVE mg/dL
Specific Gravity, Urine: 1.014 (ref 1.005–1.030)
pH: 7 (ref 5.0–8.0)

## 2022-05-27 LAB — CBC
HCT: 40.3 % (ref 36.0–46.0)
Hemoglobin: 13.7 g/dL (ref 12.0–15.0)
MCH: 32.5 pg (ref 26.0–34.0)
MCHC: 34 g/dL (ref 30.0–36.0)
MCV: 95.7 fL (ref 80.0–100.0)
Platelets: 267 10*3/uL (ref 150–400)
RBC: 4.21 MIL/uL (ref 3.87–5.11)
RDW: 12.1 % (ref 11.5–15.5)
WBC: 8.7 10*3/uL (ref 4.0–10.5)
nRBC: 0 % (ref 0.0–0.2)

## 2022-05-27 LAB — CBG MONITORING, ED: Glucose-Capillary: 96 mg/dL (ref 70–99)

## 2022-05-27 LAB — BASIC METABOLIC PANEL
Anion gap: 8 (ref 5–15)
BUN: 14 mg/dL (ref 6–20)
CO2: 25 mmol/L (ref 22–32)
Calcium: 9.5 mg/dL (ref 8.9–10.3)
Chloride: 104 mmol/L (ref 98–111)
Creatinine, Ser: 0.95 mg/dL (ref 0.44–1.00)
GFR, Estimated: >60 mL/min (ref >60)
Glucose, Bld: 97 mg/dL (ref 70–99)
Potassium: 3.8 mmol/L (ref 3.5–5.1)
Sodium: 137 mmol/L (ref 135–145)

## 2022-05-27 NOTE — ED Notes (Signed)
Pt's IV removed. Triage RN aware. Pt is deciding to leave AMA

## 2022-05-27 NOTE — ED Triage Notes (Signed)
Per EMS- patient c/o dizziness and nausea that started 30 minutes prior to their arrival.   Patient was given Zofran per EMS.

## 2022-05-27 NOTE — ED Provider Triage Note (Signed)
Emergency Medicine Provider Triage Evaluation Note  Dana Schmitt , a 61 y.o. female  was evaluated in triage.  Pt complains of dizzy. Report acute onset of dizzy/lightheadedness when she stood up a bit earlier today. Felt nauseous and fatigue.  Nausea improves with zofran.  Have not had lunch. No vertiginous sxs  Review of Systems  Positive: As above Negative: As above  Physical Exam  BP (!) 145/85 (BP Location: Right Arm)   Pulse 68   Temp 98.3 F (36.8 C) (Oral)   Resp 13   Ht 5\' 3"  (1.6 m)   Wt 63 kg   SpO2 100%   BMI 24.62 kg/m  Gen:   Awake, no distress   Resp:  Normal effort  MSK:   Moves extremities without difficulty  Other:    Medical Decision Making  Medically screening exam initiated at 2:29 PM.  Appropriate orders placed.  Dana Schmitt was informed that the remainder of the evaluation will be completed by another provider, this initial triage assessment does not replace that evaluation, and the importance of remaining in the ED until their evaluation is complete.     Domenic Moras, PA-C 05/27/22 1430

## 2022-05-27 NOTE — ED Notes (Addendum)
Pt is aware urine sample is needed; prompted x2. Pt states she is unable to urinate at this time.

## 2024-05-29 ENCOUNTER — Encounter: Payer: Self-pay | Admitting: Internal Medicine

## 2024-07-13 ENCOUNTER — Ambulatory Visit

## 2024-07-13 ENCOUNTER — Encounter: Payer: Self-pay | Admitting: Internal Medicine

## 2024-07-13 VITALS — Ht 62.0 in | Wt 115.0 lb

## 2024-07-13 DIAGNOSIS — Z860100 Personal history of colon polyps, unspecified: Secondary | ICD-10-CM

## 2024-07-13 MED ORDER — NA SULFATE-K SULFATE-MG SULF 17.5-3.13-1.6 GM/177ML PO SOLN: 1.0000 | Freq: Once | ORAL | 0 refills | Status: AC

## 2024-07-13 MED ORDER — ONDANSETRON HCL 4 MG PO TABS: 4.0000 mg | ORAL_TABLET | Freq: Three times a day (TID) | ORAL | 1 refills | Status: AC | PRN

## 2024-07-13 NOTE — Progress Notes (Signed)
No egg or soy allergy known to patient  No issues known to pt with past sedation with any surgeries or procedures Patient denies ever being told they had issues or difficulty with intubation  No FH of Malignant Hyperthermia Pt is not on diet pills Pt is not on home 02  Pt is not on blood thinners  Pt denies issues with chronic constipation  No A fib or A flutter Have any cardiac testing pending--no Pt instructed to use Singlecare.com or GoodRx for a price reduction on prep  Ambulates independently

## 2024-07-23 ENCOUNTER — Ambulatory Visit (AMBULATORY_SURGERY_CENTER): Admitting: Internal Medicine

## 2024-07-23 ENCOUNTER — Encounter: Payer: Self-pay | Admitting: Internal Medicine

## 2024-07-23 VITALS — BP 99/65 | HR 71 | Temp 97.5°F | Resp 12 | Ht 62.0 in | Wt 115.0 lb

## 2024-07-23 DIAGNOSIS — K648 Other hemorrhoids: Secondary | ICD-10-CM

## 2024-07-23 DIAGNOSIS — Z1211 Encounter for screening for malignant neoplasm of colon: Secondary | ICD-10-CM

## 2024-07-23 DIAGNOSIS — K6289 Other specified diseases of anus and rectum: Secondary | ICD-10-CM

## 2024-07-23 DIAGNOSIS — Z860100 Personal history of colon polyps, unspecified: Secondary | ICD-10-CM

## 2024-07-23 DIAGNOSIS — K5730 Diverticulosis of large intestine without perforation or abscess without bleeding: Secondary | ICD-10-CM

## 2024-07-23 DIAGNOSIS — Z860101 Personal history of adenomatous and serrated colon polyps: Secondary | ICD-10-CM | POA: Diagnosis not present

## 2024-07-23 MED ORDER — SODIUM CHLORIDE 0.9 % IV SOLN: 500.0000 mL | Freq: Once | INTRAVENOUS | Status: DC

## 2024-07-23 NOTE — Progress Notes (Signed)
 Pt's states no medical or surgical changes since previsit or office visit.

## 2024-07-23 NOTE — Progress Notes (Signed)
 A/o x 3, VSS, gd SR's, pleased with anesthesia, report to RN

## 2024-07-23 NOTE — Patient Instructions (Addendum)
-   Diverticulosis and internal hemorrhoids - Repeat colonoscopy in 10 years for surveillance. - Patient has a contact number available for    emergencies. The signs and symptoms of potential    delayed complications were discussed with the    patient. Return to normal activities tomorrow.   Written discharge instructions were provided to the    patient. - Resume previous diet. - Continue present medications.  YOU HAD AN ENDOSCOPIC PROCEDURE TODAY AT THE [facility]:   Refer to the procedure report that was given to you for any specific questions about what was found during the examination.  If the procedure report does not answer your questions, please call your gastroenterologist to clarify.  If you requested that your care partner not be given the details of your procedure findings, then the procedure report has been included in a sealed envelope for you to review at your convenience later.  YOU SHOULD EXPECT: Some feelings of bloating in the abdomen. Passage of more gas than usual.  Walking can help get rid of the air that was put into your GI tract during the procedure and reduce the bloating. If you had a lower endoscopy (such as a colonoscopy or flexible sigmoidoscopy) you may notice spotting of blood in your stool or on the toilet paper. If you underwent a bowel prep for your procedure, you may not have a normal bowel movement for a few days.  Please Note:  You might notice some irritation and congestion in your nose or some drainage.  This is from the oxygen used during your procedure.  There is no need for concern and it should clear up in a day or so.  SYMPTOMS TO REPORT IMMEDIATELY:  Following lower endoscopy (colonoscopy or flexible sigmoidoscopy):  Excessive amounts of blood in the stool  Significant tenderness or worsening of abdominal pains  Swelling of the abdomen that is new, acute  Fever of 100F or higher   For urgent or emergent issues, a gastroenterologist  can be reached at any hour by calling [phone]. Do not use MyChart messaging for urgent concerns.    DIET:  We do recommend a small meal at first, but then you may proceed to your regular diet.  Drink plenty of fluids but you should avoid alcoholic beverages for 24 hours.  ACTIVITY:  You should plan to take it easy for the rest of today and you should NOT DRIVE or use heavy machinery until tomorrow (because of the sedation medicines used during the test).    FOLLOW UP: Our staff will call the number listed on your records the next business day following your procedure.  We will call around 7:15- 8:00 am to check on you and address any questions or concerns that you may have regarding the information given to you following your procedure. If we do not reach you, we will leave a message.     If any biopsies were taken you will be contacted by phone or by letter within the next 1-3 weeks.  Please call us  at [phone] if you have not heard about the biopsies in 3 weeks.    SIGNATURES/CONFIDENTIALITY: You and/or your care partner have signed paperwork which will be entered into your electronic medical record.  These signatures attest to the fact that that the information above on your After Visit Summary has been reviewed and is understood.  Full responsibility of the confidentiality of this discharge information lies with you and/or your care-partner.

## 2024-07-23 NOTE — Progress Notes (Signed)
 HISTORY OF PRESENT ILLNESS:  Dana Schmitt is a 63 y.o. female with a history of adenomatous colon polyp 2017 with Dr. Luis.  Now for surveillance colonoscopy  REVIEW OF SYSTEMS:  All non-GI ROS negative except for  Past Medical History:  Diagnosis Date   High cholesterol    Hypertension    Thyroid disease     Past Surgical History:  Procedure Laterality Date   BUNIONECTOMY Bilateral    COLONOSCOPY      Social History Dana Schmitt  reports that she has been smoking cigarettes. She has never used smokeless tobacco. She reports current alcohol use of about 6.0 standard drinks of alcohol per week. She reports that she does not use drugs.  family history is not on file. She was adopted.  No Known Allergies     PHYSICAL EXAMINATION: Vital signs: BP 102/68   Pulse 75   Temp (!) 97.5 F (36.4 C)   Ht 5' 2 (1.575 m)   Wt 115 lb (52.2 kg)   SpO2 100%   BMI 21.03 kg/m  General: Well-developed, well-nourished, no acute distress HEENT: Sclerae are anicteric, conjunctiva pink. Oral mucosa intact Lungs: Clear Heart: Regular Abdomen: soft, nontender, nondistended, no obvious ascites, no peritoneal signs, normal bowel sounds. No organomegaly. Extremities: No edema Psychiatric: alert and oriented x3. Cooperative     ASSESSMENT:  History of adenomatous polyp   PLAN:  Surveillance colonoscopy

## 2024-07-23 NOTE — Op Note (Signed)
 [facility] Patient Name: Dana Schmitt Procedure Date: 07/23/2024 11:48 AM MRN: [id number] Endoscopist: Norleen SAILOR. Abran , MD, [id number] Age: 63 Referring MD:  Date of Birth: 05-29-1961 Gender: Female Account #: [id number] Procedure:                Colonoscopy Indications:              High risk colon cancer surveillance: Personal                            history of non-advanced adenoma (Dr. Luis 2017) Medicines:                Monitored Anesthesia Care Procedure:                Pre-Anesthesia Assessment:                           - Prior to the procedure, a History and Physical                            was performed, and patient medications and                            allergies were reviewed. The patient's tolerance of                            previous anesthesia was also reviewed. The risks                            and benefits of the procedure and the sedation                            options and risks were discussed with the patient.                            All questions were answered, and informed consent                            was obtained. Prior Anticoagulants: The patient has                            taken no anticoagulant or antiplatelet agents. ASA                            Grade Assessment: II - A patient with mild systemic                            disease. After reviewing the risks and benefits,                            the patient was deemed in satisfactory condition to                            undergo the procedure.  After obtaining informed consent, the colonoscope                            was passed under direct vision. Throughout the                            procedure, the patient's blood pressure, pulse, and                            oxygen saturations were monitored continuously. The                            CF [id number] #[id number] was introduced through the anus                            and advanced  to the the cecum, identified by                            appendiceal orifice and ileocecal valve. The                            ileocecal valve, appendiceal orifice, and rectum                            were photographed. The quality of the bowel                            preparation was excellent. The colonoscopy was                            performed without difficulty. The patient tolerated                            the procedure well. The bowel preparation used was                            Miralax via split dose instruction. Scope In: 11:58:32 AM Scope Out: 12:07:46 PM Scope Withdrawal Time: 0 hours 6 minutes 43 seconds  Total Procedure Duration: 0 hours 9 minutes 14 seconds  Findings:                 Many diverticula were found in the entire colon.                           Internal hemorrhoids were found during                            retroflexion. Scarring of the distal rectal mucosa                            from prior hemorrhoidal banding noted. Hypertrophic                            anal papilla noted.  The exam was otherwise without abnormality on                            direct and retroflexion views. Complications:            No immediate complications. Estimated blood loss:                            None. Estimated Blood Loss:     Estimated blood loss: none. Impression:               - Diverticulosis in the entire examined colon.                           - Internal hemorrhoids. Prior hemorrhoidal banding.                            Hypertrophic anal papilla.                           - The examination was otherwise normal on direct                            and retroflexion views.                           - No specimens collected. Recommendation:           - Repeat colonoscopy in 10 years for surveillance.                           - Patient has a contact number available for                            emergencies. The signs  and symptoms of potential                            delayed complications were discussed with the                            patient. Return to normal activities tomorrow.                            Written discharge instructions were provided to the                            patient.                           - Resume previous diet.                           - Continue present medications. Norleen SAILOR. Abran, MD 07/23/2024 12:13:19 PM This report has been signed electronically.

## 2024-07-24 ENCOUNTER — Telehealth: Payer: Self-pay

## 2024-07-24 NOTE — Telephone Encounter (Signed)
  Follow up Call-     07/23/2024   11:05 AM  Call back number  Post procedure Call Back phone  # [phone]  Permission to leave phone message Yes     Patient questions:  Do you have a fever, pain , or abdominal swelling? No. Pain Score  0 *  Have you tolerated food without any problems? Yes.    Have you been able to return to your normal activities? Yes.    Do you have any questions about your discharge instructions: Diet   No. Medications  No. Follow up visit  No.  Do you have questions or concerns about your Care? No.  Actions: * If pain score is 4 or above: No action needed, pain <4.
